# Patient Record
Sex: Male | Born: 1955 | Race: White | Hispanic: No | Marital: Married | Smoking: Light tobacco smoker
Health system: Southern US, Community
[De-identification: ages and names within clinical notes are randomized; demographics above are authoritative.]

## PROBLEM LIST (undated history)

## (undated) DIAGNOSIS — N189 Chronic kidney disease, unspecified: Secondary | ICD-10-CM

## (undated) DIAGNOSIS — T7840XA Allergy, unspecified, initial encounter: Secondary | ICD-10-CM

## (undated) DIAGNOSIS — F419 Anxiety disorder, unspecified: Secondary | ICD-10-CM

## (undated) HISTORY — DX: Anxiety disorder, unspecified: F41.9

## (undated) HISTORY — PX: OTHER SURGICAL HISTORY: SHX169

## (undated) HISTORY — DX: Allergy, unspecified, initial encounter: T78.40XA

## (undated) HISTORY — DX: Chronic kidney disease, unspecified: N18.9

---

## 1998-08-17 ENCOUNTER — Emergency Department (HOSPITAL_COMMUNITY): Admission: EM | Admit: 1998-08-17 | Discharge: 1998-08-17 | Payer: Self-pay | Admitting: Emergency Medicine

## 1998-08-17 ENCOUNTER — Encounter: Payer: Self-pay | Admitting: Emergency Medicine

## 1998-08-20 ENCOUNTER — Encounter: Payer: Self-pay | Admitting: Urology

## 1998-08-20 ENCOUNTER — Ambulatory Visit (HOSPITAL_COMMUNITY): Admission: RE | Admit: 1998-08-20 | Discharge: 1998-08-20 | Payer: Self-pay | Admitting: Urology

## 1998-11-07 ENCOUNTER — Ambulatory Visit (HOSPITAL_COMMUNITY): Admission: RE | Admit: 1998-11-07 | Discharge: 1998-11-07 | Payer: Self-pay | Admitting: Urology

## 1998-11-07 ENCOUNTER — Encounter: Payer: Self-pay | Admitting: Urology

## 2000-06-25 ENCOUNTER — Observation Stay (HOSPITAL_COMMUNITY): Admission: RE | Admit: 2000-06-25 | Discharge: 2000-06-26 | Payer: Self-pay | Admitting: Orthopedic Surgery

## 2003-06-19 ENCOUNTER — Encounter: Admission: RE | Admit: 2003-06-19 | Discharge: 2003-06-19 | Payer: Self-pay | Admitting: Orthopedic Surgery

## 2008-10-19 ENCOUNTER — Emergency Department (HOSPITAL_COMMUNITY): Admission: EM | Admit: 2008-10-19 | Discharge: 2008-10-19 | Payer: Self-pay | Admitting: Emergency Medicine

## 2008-10-23 ENCOUNTER — Ambulatory Visit (HOSPITAL_COMMUNITY): Admission: AD | Admit: 2008-10-23 | Discharge: 2008-10-23 | Payer: Self-pay | Admitting: Urology

## 2010-07-07 LAB — BASIC METABOLIC PANEL
CO2: 21 mEq/L (ref 19–32)
Calcium: 9.3 mg/dL (ref 8.4–10.5)
Chloride: 109 mEq/L (ref 96–112)
GFR calc Af Amer: >60 mL/min (ref >60)
Glucose, Bld: 115 mg/dL — ABNORMAL HIGH (ref 70–99)
Potassium: 3.5 mEq/L (ref 3.5–5.1)
Sodium: 139 mEq/L (ref 135–145)

## 2010-07-07 LAB — URINALYSIS, ROUTINE W REFLEX MICROSCOPIC
Bilirubin Urine: NEGATIVE
Ketones, ur: 15 mg/dL — AB
Specific Gravity, Urine: 1.026 (ref 1.005–1.030)
pH: 8.5 — ABNORMAL HIGH (ref 5.0–8.0)

## 2010-07-07 LAB — CBC
HCT: 44 % (ref 39.0–52.0)
Hemoglobin: 15.1 g/dL (ref 13.0–17.0)
MCHC: 34.4 g/dL (ref 30.0–36.0)
RBC: 4.63 MIL/uL (ref 4.22–5.81)
RDW: 13.9 % (ref 11.5–15.5)

## 2010-07-07 LAB — DIFFERENTIAL
Basophils Relative: 1 % (ref 0–1)
Eosinophils Relative: 2 % (ref 0–5)
Monocytes Absolute: 0.7 10*3/uL (ref 0.1–1.0)
Monocytes Relative: 8 % (ref 3–12)
Neutro Abs: 5.7 10*3/uL (ref 1.7–7.7)

## 2010-07-07 LAB — URINE MICROSCOPIC-ADD ON

## 2010-08-16 NOTE — Op Note (Signed)
[facility]  Patient:    Cameron Ryan, Cameron Ryan                        MRN: [id number] Proc. Date: 06/25/00 Adm. Date:  [id number] Disc. Date: [id number] Attending:  Marlowe Kays Page                           Operative Report  PREOPERATIVE DIAGNOSIS:  Torn medial meniscus left knee.  POSTOPERATIVE DIAGNOSES: 1. Torn medial meniscus. 2. Medial sharp plica left knee.  OPERATION: 1. Left knee arthroscopy with one partial medial meniscectomy. 2. Incision of large suprapatellar plica.  SURGEON:  Illene Labrador. Aplington, M.D.  ASSISTANT:  Nurse.  ANESTHESIA:  General.  PATHOLOGY AND JUSTIFICATION FOR PROCEDURE:  He has had longstanding achy-type pain in his knee and more recently, much more of a problem with something causing his knee to lock with popping in the knee.  He has tenderness over the medial joint line.  X-ray is normal.  It is felt he most likely had a torn medial meniscus.  At surgery, he was also found to have a large medial sharp plica which was quite thick and appeared to be actually maybe a groove in the articular surface of the patella in its medial third.  The torn medial meniscus was an anterior bucket-handle type tear.  The remainder of his joint looked normal.  DESCRIPTION OF PROCEDURE:  Satisfactory general anesthesia, pneumatic tourniquet, thigh stabilizer.  The left knee was prepped with Dura-Prep and draped in a sterile field.  Superior medial saline inflow.  First through an anterior medial portal, I looked at the lateral compartment of knee joint which was normal on probing, looking at the lateral gutter, no definite abnormalities were noted through this approach, although I could not get around the superior portion of the lateral femoral condyle because of his knee anatomy.  I then reversed portals.  In the anterior joint, there was a good bit of synovitis and something blocking the arthroscopic view.  I went to take a picture of  this, and our printer was not working, so we had to go without the prints for this particular case.  After removing synovium, the bucket-handle type tear became apparent.  I was able to cut the posterior portion of the bucket-handle with scissors and then removed the fragment with the 3.5 shaver as well as smoothing down the junction of the normal and abnormal anterior third of the meniscus with 3.5 shaver and scissors.  Looking posteriorly, the joint surfaces looked normal, and there was no abnormality of the medial meniscus which was stable on probing.  I then looked up in the medial gutter and suprapatellar area.  There was some mild wear of the patella which did not require shaving, but the striking finding was the large medial sharp plica, very fibrotic, as noted above.  I cut this with scissors and then resected it with 3.5 shaver.  The knee joint was then irrigated until clear and all fluid possible removed.  The two anterior portals were closed with 4-0 nylon, 20 cc of 0.5% Marcaine with adrenalin and 4 mg of morphine were then instilled through the inferior apparatus which was removed and this portal closed with 4-0 nylon as well.  Betadine and Adaptic dry sterile dressing were applied.  Tourniquet was released.  He tolerated the procedure well and was taken to the recovery room in satisfactory condition  with no known complications. DD:  06/25/00 TD:  06/26/00 Job: [id number]

## 2011-03-15 IMAGING — CT CT PELVIS W/O CM
2 of 4 series · 17 of 46 positions shown, 19 images · non-contrast
Comparison: None available.  Report of prior exam 08/17/1998 is
reviewed.

CT ABDOMEN

CLINICAL DATA: Left-sided flank pain

CT ABDOMEN AND PELVIS WITHOUT CONTRAST
TECHNIQUE: Multidetector CT imaging of the abdomen and pelvis was
performed following the standard protocol without intravenous
contrast.

[Series 2: renal stone · axial · 0.88mm/px · z∈[-516,-76]mm · 14 of 96 slices shown, 16 images]
[im 4/96  soft-tissue]
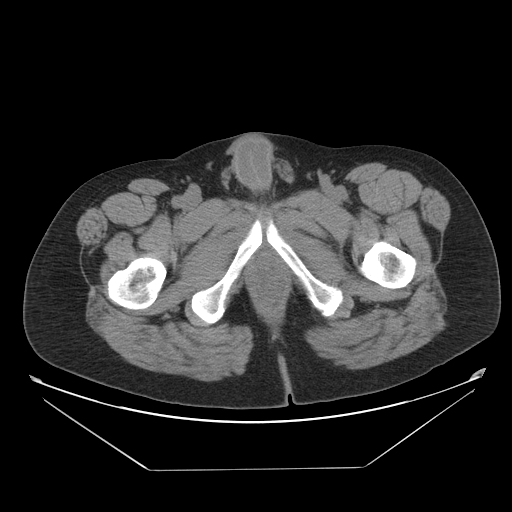
[im 4/96  bone]
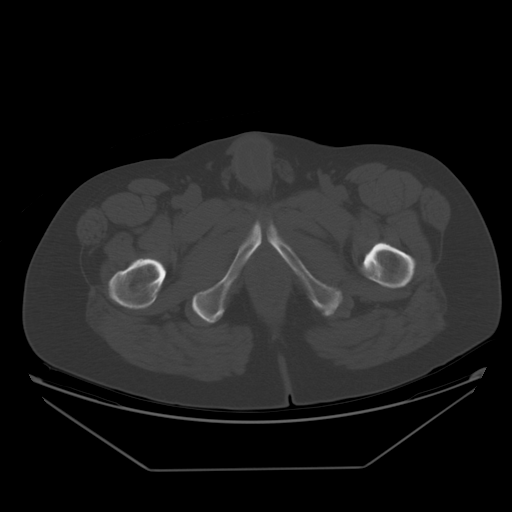
[im 12/96  soft-tissue]
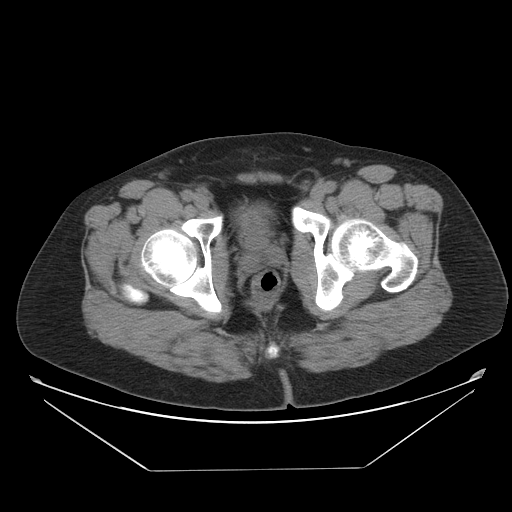
[im 20/96  soft-tissue]
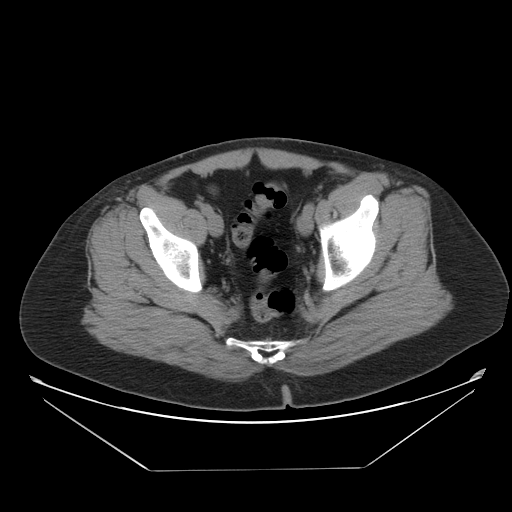
[im 27/96  soft-tissue]
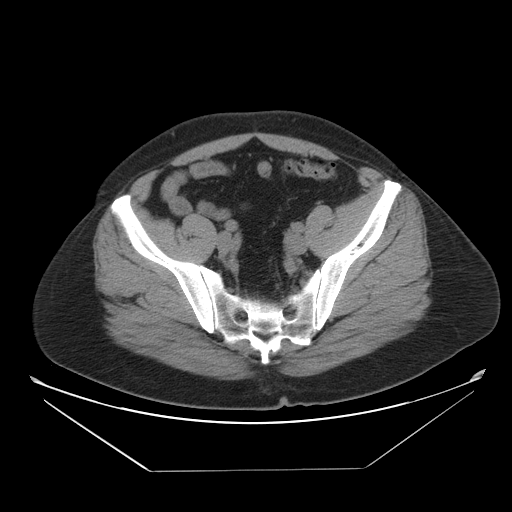
[im 31/96  soft-tissue]
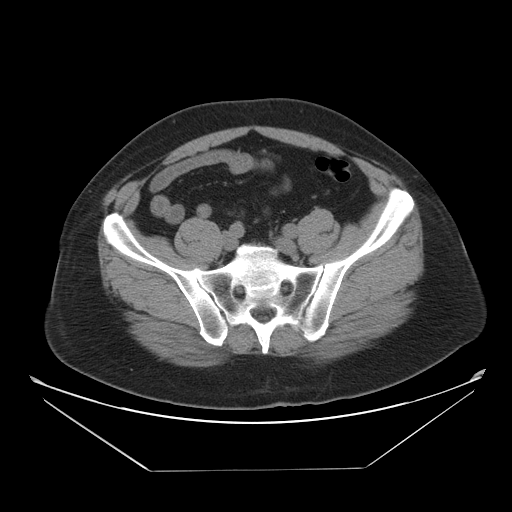
[im 39/96  soft-tissue]
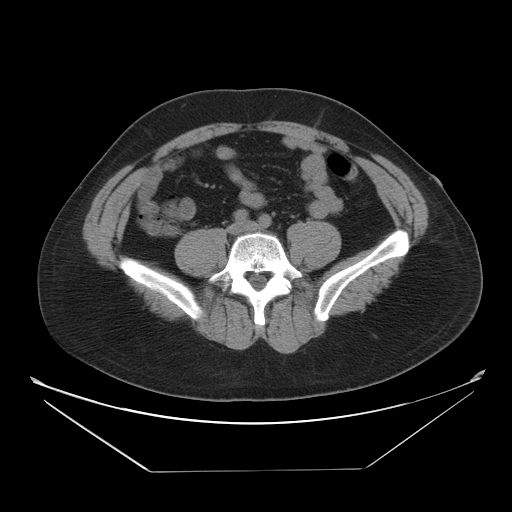
[im 46/96  soft-tissue]
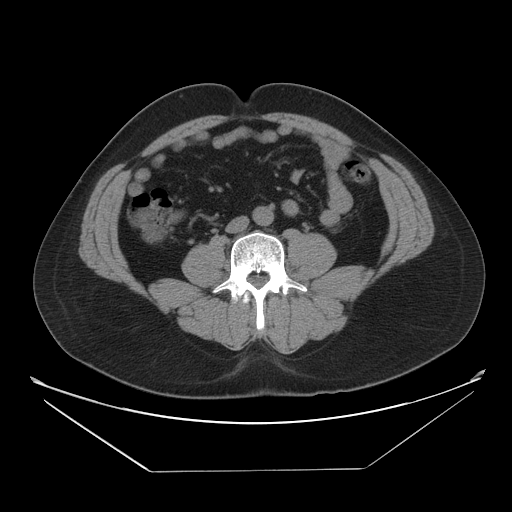
[im 50/96  soft-tissue]
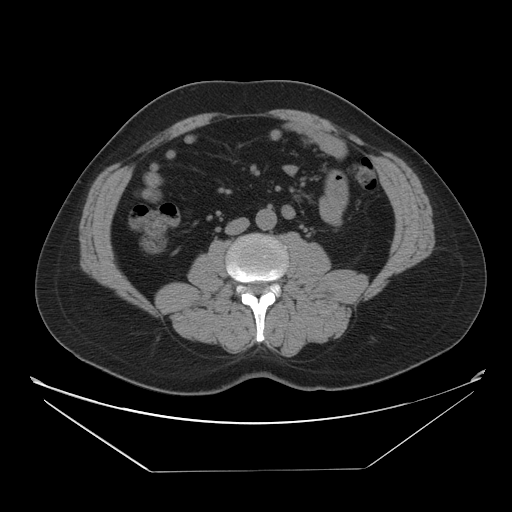
[im 58/96  soft-tissue]
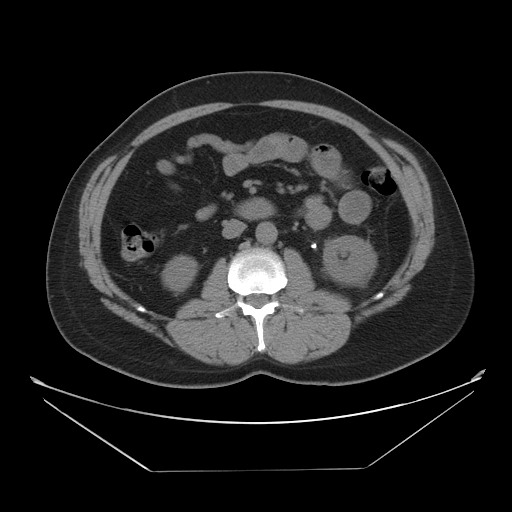
[im 58/96  bone]
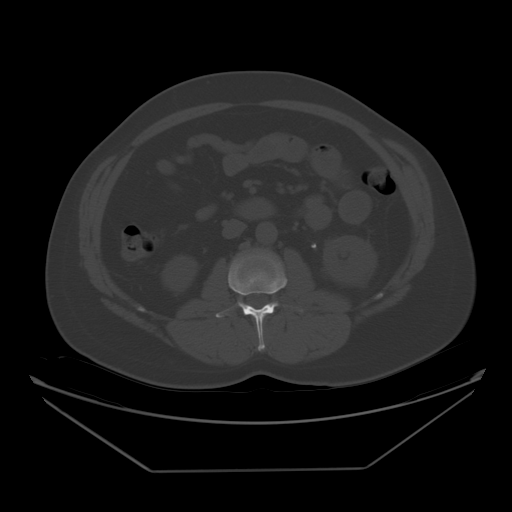
[im 65/96  soft-tissue]
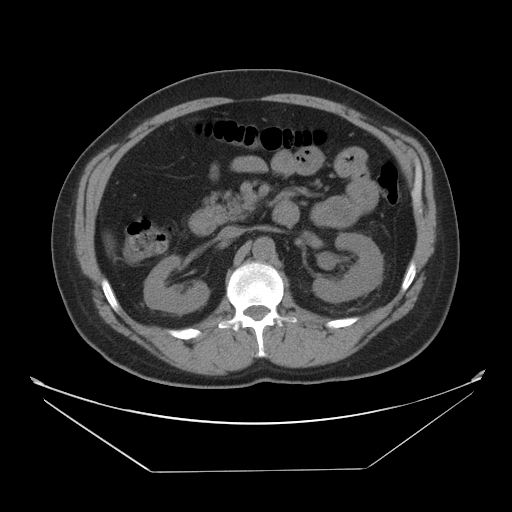
[im 73/96  soft-tissue]
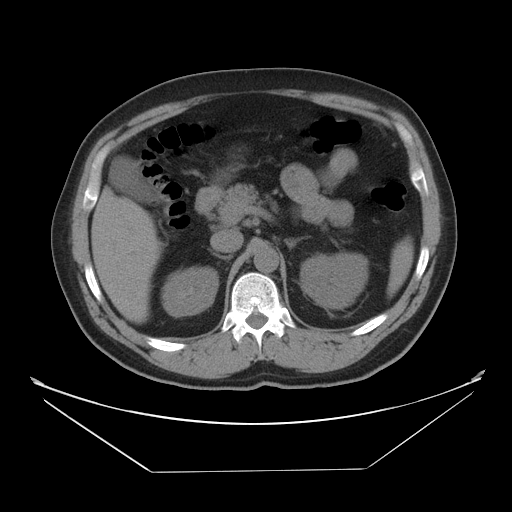
[im 77/96  soft-tissue]
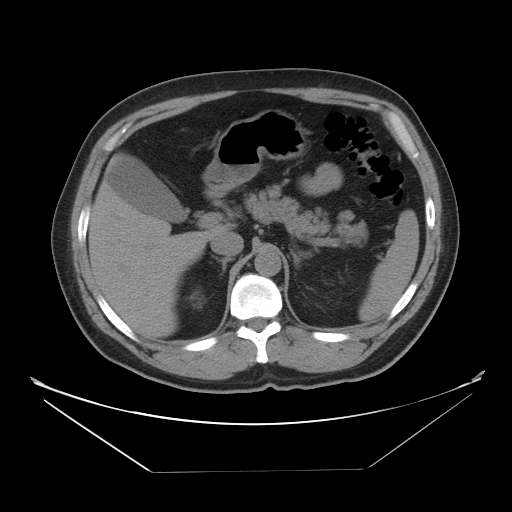
[im 84/96  soft-tissue]
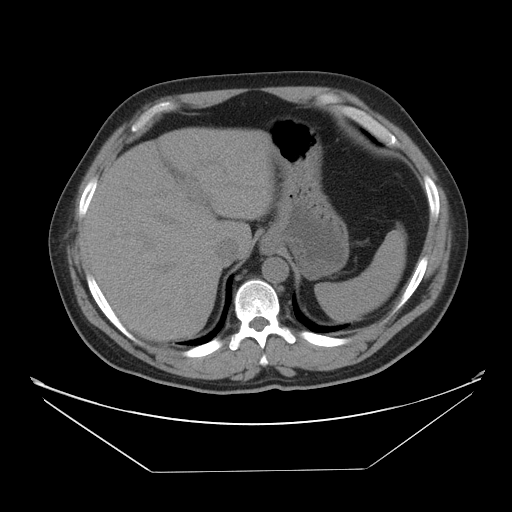
[im 92/96  soft-tissue]
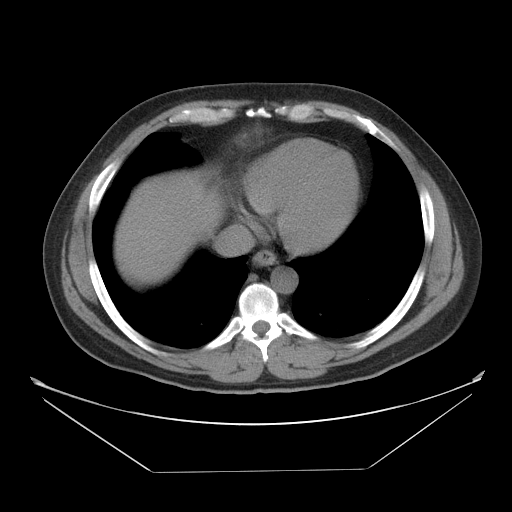

[Series 401: coronals · coronal · 0.90mm/px · 3 of 146 slices shown]
[im 49/146  soft-tissue]
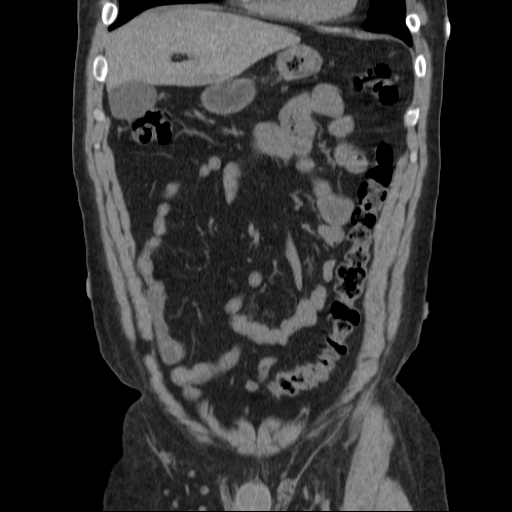
[im 65/146  soft-tissue]
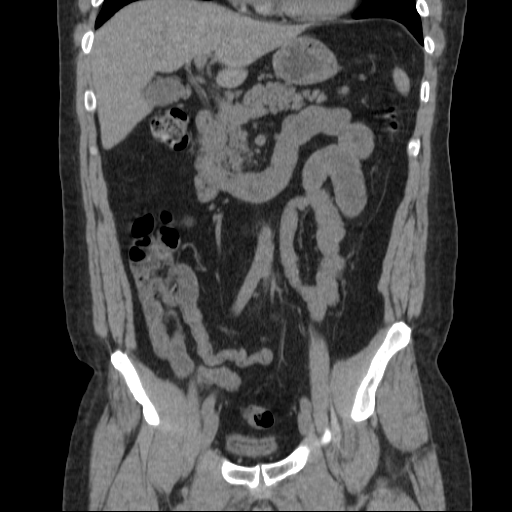
[im 81/146  soft-tissue]
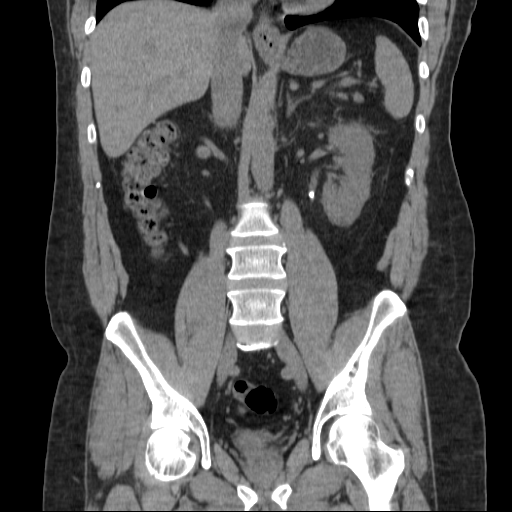

[17 of 46 positions shown; findings below may reference images not displayed]

FINDINGS: Minimal dependent atelectasis noted.

5 mm proximal left ureteral stone noted with mild left
hydronephrosis, left renal enlargement, and left perinephric
stranding.  Unenhanced abdominal viscera otherwise unremarkable. No
other radiopaque renal or ureteral calculus is identified.
IMPRESSION: 5 mm proximal left ureteral calculus with mild left hydronephrosis.

CT PELVIS
FINDINGS: Pelvic viscera are unremarkable.  No free fluid or
adenopathy.  No acute osseous abnormality.
IMPRESSION: No acute intrapelvic finding.  Please see CT abdomen report above.

## 2018-08-03 ENCOUNTER — Encounter: Payer: Self-pay | Admitting: Internal Medicine

## 2018-08-03 ENCOUNTER — Ambulatory Visit (AMBULATORY_SURGERY_CENTER): Payer: Self-pay

## 2018-08-03 ENCOUNTER — Other Ambulatory Visit: Payer: Self-pay

## 2018-08-03 VITALS — Ht 73.0 in | Wt 220.0 lb

## 2018-08-03 DIAGNOSIS — Z1211 Encounter for screening for malignant neoplasm of colon: Secondary | ICD-10-CM

## 2018-08-03 MED ORDER — NA SULFATE-K SULFATE-MG SULF 17.5-3.13-1.6 GM/177ML PO SOLN: 1.0000 | Freq: Once | ORAL | 0 refills | Status: AC

## 2018-08-03 NOTE — Progress Notes (Signed)
Denies allergies to eggs or soy products. Denies complication of anesthesia or sedation. Denies use of weight loss medication. Denies use of O2.   Emmi instructions given for colonoscopy.  Pre-Visit was conducted by phone due to Covid 19. Instructions were reviewed with patient. Cameron Ryan is going to pick up his information envelope on Wednesday 08/04/18 because his procedure is Monday.  Patient was encouraged to call with any questions or concerns. A 15.00 coupon for Suprep was given to patient.

## 2018-08-06 ENCOUNTER — Telehealth: Payer: Self-pay | Admitting: *Deleted

## 2018-08-06 NOTE — Telephone Encounter (Signed)
LMOM to confirm pt's appointment and to go over COVID screening questions.  Will call back

## 2018-08-06 NOTE — Telephone Encounter (Signed)
Left a detailed message to wear a mask into building, care partner will be waiting in car during procedure.  Also to call back if he answers yes to- travel in the past two weeks, fever or respiratory problems in the past two weeks or had any family/close contacts diagnosed with covid 19

## 2018-08-09 ENCOUNTER — Encounter: Payer: Self-pay | Admitting: Internal Medicine

## 2018-08-09 ENCOUNTER — Other Ambulatory Visit: Payer: Self-pay

## 2018-08-09 ENCOUNTER — Ambulatory Visit (AMBULATORY_SURGERY_CENTER): Payer: BLUE CROSS/BLUE SHIELD | Admitting: Internal Medicine

## 2018-08-09 VITALS — BP 127/65 | HR 64 | Temp 98.4°F | Resp 21 | Ht 73.0 in | Wt 220.0 lb

## 2018-08-09 DIAGNOSIS — K635 Polyp of colon: Secondary | ICD-10-CM

## 2018-08-09 DIAGNOSIS — D120 Benign neoplasm of cecum: Secondary | ICD-10-CM | POA: Diagnosis not present

## 2018-08-09 DIAGNOSIS — Z1211 Encounter for screening for malignant neoplasm of colon: Secondary | ICD-10-CM | POA: Diagnosis present

## 2018-08-09 MED ORDER — SODIUM CHLORIDE 0.9 % IV SOLN: 500.0000 mL | Freq: Once | INTRAVENOUS | Status: DC

## 2018-08-09 NOTE — Patient Instructions (Signed)
YOU HAD AN ENDOSCOPIC PROCEDURE TODAY AT [facility]:   Refer to the procedure report that was given to you for any specific questions about what was found during the examination.  If the procedure report does not answer your questions, please call your gastroenterologist to clarify.  If you requested that your care partner not be given the details of your procedure findings, then the procedure report has been included in a sealed envelope for you to review at your convenience later.  YOU SHOULD EXPECT: Some feelings of bloating in the abdomen. Passage of more gas than usual.  Walking can help get rid of the air that was put into your GI tract during the procedure and reduce the bloating. If you had a lower endoscopy (such as a colonoscopy or flexible sigmoidoscopy) you may notice spotting of blood in your stool or on the toilet paper. If you underwent a bowel prep for your procedure, you may not have a normal bowel movement for a few days.  Please Note:  You might notice some irritation and congestion in your nose or some drainage.  This is from the oxygen used during your procedure.  There is no need for concern and it should clear up in a day or so.  SYMPTOMS TO REPORT IMMEDIATELY:   Following lower endoscopy (colonoscopy or flexible sigmoidoscopy):  Excessive amounts of blood in the stool  Significant tenderness or worsening of abdominal pains  Swelling of the abdomen that is new, acute  Fever of 100F or higher   For urgent or emergent issues, a gastroenterologist can be reached at any hour by calling [phone].   DIET:  We do recommend a small meal at first, but then you may proceed to your regular diet.  Drink plenty of fluids but you should avoid alcoholic beverages for 24 hours.  Try to increase the fiber in your diet, and drink plenty of water.  ACTIVITY:  You should plan to take it easy for the rest of today and you should NOT DRIVE or use heavy machinery until  tomorrow (because of the sedation medicines used during the test).    FOLLOW UP: Our staff will call the number listed on your records the next business day following your procedure to check on you and address any questions or concerns that you may have regarding the information given to you following your procedure. If we do not reach you, we will leave a message.  However, if you are feeling well and you are not experiencing any problems, there is no need to return our call.  We will assume that you have returned to your regular daily activities without incident. We will be calling you two weeks following your procedure to see if you have developed any symptoms of the COVID-19.  If you develop any symptoms before then, please let us know.  If any biopsies were taken you will be contacted by phone or by letter within the next 1-3 weeks.  Please call us at [phone] if you have not heard about the biopsies in 3 weeks.    SIGNATURES/CONFIDENTIALITY: You and/or your care partner have signed paperwork which will be entered into your electronic medical record.  These signatures attest to the fact that that the information above on your After Visit Summary has been reviewed and is understood.  Full responsibility of the confidentiality of this discharge information lies with you and/or your care-partner.  Read al handouts given to you  By  your recovery room nurse.

## 2018-08-09 NOTE — Progress Notes (Signed)
Pt's states no medical or surgical changes since previsit or office visit. 

## 2018-08-09 NOTE — Op Note (Signed)
[facility] Patient Name: Cameron Ryan Procedure Date: 08/09/2018 11:17 AM MRN: [id number] Endoscopist: Jerene Bears , MD Age: 63 Referring MD:  Date of Birth: 05/31/1955 Gender: Male Account #: [id number] Procedure:                Colonoscopy Indications:              Screening for colorectal malignant neoplasm, This                            is the patient's first colonoscopy Medicines:                Monitored Anesthesia Care Procedure:                Pre-Anesthesia Assessment:                           - Prior to the procedure, a History and Physical                            was performed, and patient medications and                            allergies were reviewed. The patient's tolerance of                            previous anesthesia was also reviewed. The risks                            and benefits of the procedure and the sedation                            options and risks were discussed with the patient.                            All questions were answered, and informed consent                            was obtained. Prior Anticoagulants: The patient has                            taken no previous anticoagulant or antiplatelet                            agents. ASA Grade Assessment: II - A patient with                            mild systemic disease. After reviewing the risks                            and benefits, the patient was deemed in                            satisfactory condition to undergo the procedure.  After obtaining informed consent, the colonoscope                            was passed under direct vision. Throughout the                            procedure, the patient's blood pressure, pulse, and                            oxygen saturations were monitored continuously. The                            Colonoscope was introduced through the anus and                            advanced to the cecum,  identified by appendiceal                            orifice and ileocecal valve. The colonoscopy was                            performed without difficulty. The patient tolerated                            the procedure well. The quality of the bowel                            preparation was good. The ileocecal valve,                            appendiceal orifice, and rectum were photographed. Scope In: 11:28:59 AM Scope Out: 11:47:37 AM Scope Withdrawal Time: 0 hours 16 minutes 9 seconds  Total Procedure Duration: 0 hours 18 minutes 38 seconds  Findings:                 The digital rectal exam was normal.                           A 1 mm polyp was found in the cecum. The polyp was                            sessile. The polyp was removed with a cold biopsy                            forceps. Resection and retrieval were complete.                           A 4 mm polyp was found in the cecum. The polyp was                            sessile. The polyp was removed with a cold snare.  Resection and retrieval were complete.                           A 5 mm polyp was found in the sigmoid colon. The                            polyp was sessile. The polyp was removed with a                            cold snare. Resection and retrieval were complete.                           External hemorrhoids were found.                           No additional abnormalities were found on                            retroflexion. Complications:            No immediate complications. Estimated Blood Loss:     Estimated blood loss was minimal. Impression:               - One 1 mm polyp in the cecum, removed with a cold                            biopsy forceps. Resected and retrieved.                           - One 4 mm polyp in the cecum, removed with a cold                            snare. Resected and retrieved.                           - One 5 mm polyp in the sigmoid colon,  removed with                            a cold snare. Resected and retrieved.                           - External hemorrhoids. Recommendation:           - Patient has a contact number available for                            emergencies. The signs and symptoms of potential                            delayed complications were discussed with the                            patient. Return to normal activities tomorrow.  Written discharge instructions were provided to the                            patient.                           - Resume previous diet.                           - Continue present medications.                           - Await pathology results.                           - Repeat colonoscopy is recommended for                            surveillance. The colonoscopy date will be                            determined after pathology results from today's                            exam become available for review. Jerene Bears, MD 08/09/2018 11:52:04 AM This report has been signed electronically.

## 2018-08-09 NOTE — Progress Notes (Signed)
Called to room to assist during endoscopic procedure.  Patient ID and intended procedure confirmed with present staff. Received instructions for my participation in the procedure from the performing physician.  

## 2018-08-09 NOTE — Progress Notes (Signed)
A/ox3, pleased with MAC, report to RN 

## 2018-08-10 ENCOUNTER — Telehealth: Payer: Self-pay | Admitting: *Deleted

## 2018-08-10 NOTE — Telephone Encounter (Signed)
  Follow up Call-  Call back number 08/09/2018  Post procedure Call Back phone  # [phone]  Permission to leave phone message Yes  Some recent data might be hidden     Patient questions:  Do you have a fever, pain , or abdominal swelling? No. Pain Score  0 *  Have you tolerated food without any problems? Yes.    Have you been able to return to your normal activities? Yes.    Do you have any questions about your discharge instructions: Diet   No. Medications  No. Follow up visit  No.  Do you have questions or concerns about your Care? No.  Actions: * If pain score is 4 or above: No action needed, pain <4.

## 2018-08-11 ENCOUNTER — Encounter: Payer: Self-pay | Admitting: Internal Medicine

## 2018-08-12 ENCOUNTER — Telehealth: Payer: Self-pay | Admitting: *Deleted

## 2018-08-12 NOTE — Telephone Encounter (Signed)
1. Have you developed a fever since your procedure? no  2.   Have you had an respiratory symptoms (SOB or cough) since your procedure? no  3.   Have you tested positive for COVID 19 since your procedure no  3.   Have you had any family members/close contacts diagnosed with the COVID 19 since your procedure?  No All per pt's daughter    If any of these questions are a yes, please inquire if patient has been seen by family doctor and route this note to Joylene John, Therapist, sports.

## 2019-06-24 ENCOUNTER — Ambulatory Visit: Payer: BC Managed Care – PPO | Attending: Internal Medicine

## 2023-09-29 DEATH — deceased

## 2023-10-30 DEATH — deceased

## 2024-02-27 ENCOUNTER — Encounter: Payer: Self-pay | Admitting: Internal Medicine
